# Patient Record
Sex: Male | Born: 1965 | Race: White | Hispanic: No | Marital: Married | Smoking: Never smoker
Health system: Southern US, Community
[De-identification: ages and names within clinical notes are randomized; demographics above are authoritative.]

## PROBLEM LIST (undated history)

## (undated) DIAGNOSIS — E7800 Pure hypercholesterolemia, unspecified: Secondary | ICD-10-CM

## (undated) DIAGNOSIS — I499 Cardiac arrhythmia, unspecified: Secondary | ICD-10-CM

---

## 2000-04-25 ENCOUNTER — Emergency Department (HOSPITAL_COMMUNITY): Admission: EM | Admit: 2000-04-25 | Discharge: 2000-04-25 | Payer: Self-pay | Admitting: Emergency Medicine

## 2003-03-18 ENCOUNTER — Emergency Department (HOSPITAL_COMMUNITY): Admission: EM | Admit: 2003-03-18 | Discharge: 2003-03-18 | Payer: Self-pay

## 2006-05-06 ENCOUNTER — Emergency Department (HOSPITAL_COMMUNITY): Admission: EM | Admit: 2006-05-06 | Discharge: 2006-05-06 | Payer: Self-pay | Admitting: Emergency Medicine

## 2007-03-30 IMAGING — CR DG CHEST 1V PORT
1 series · 1 of 1 positions shown · non-contrast
Comparison: 03/18/03.

CLINICAL DATA: Chest pain.
 PORTABLE CHEST ? 1 VIEW ? 05/06/06 ? 5499:

[view not recorded]
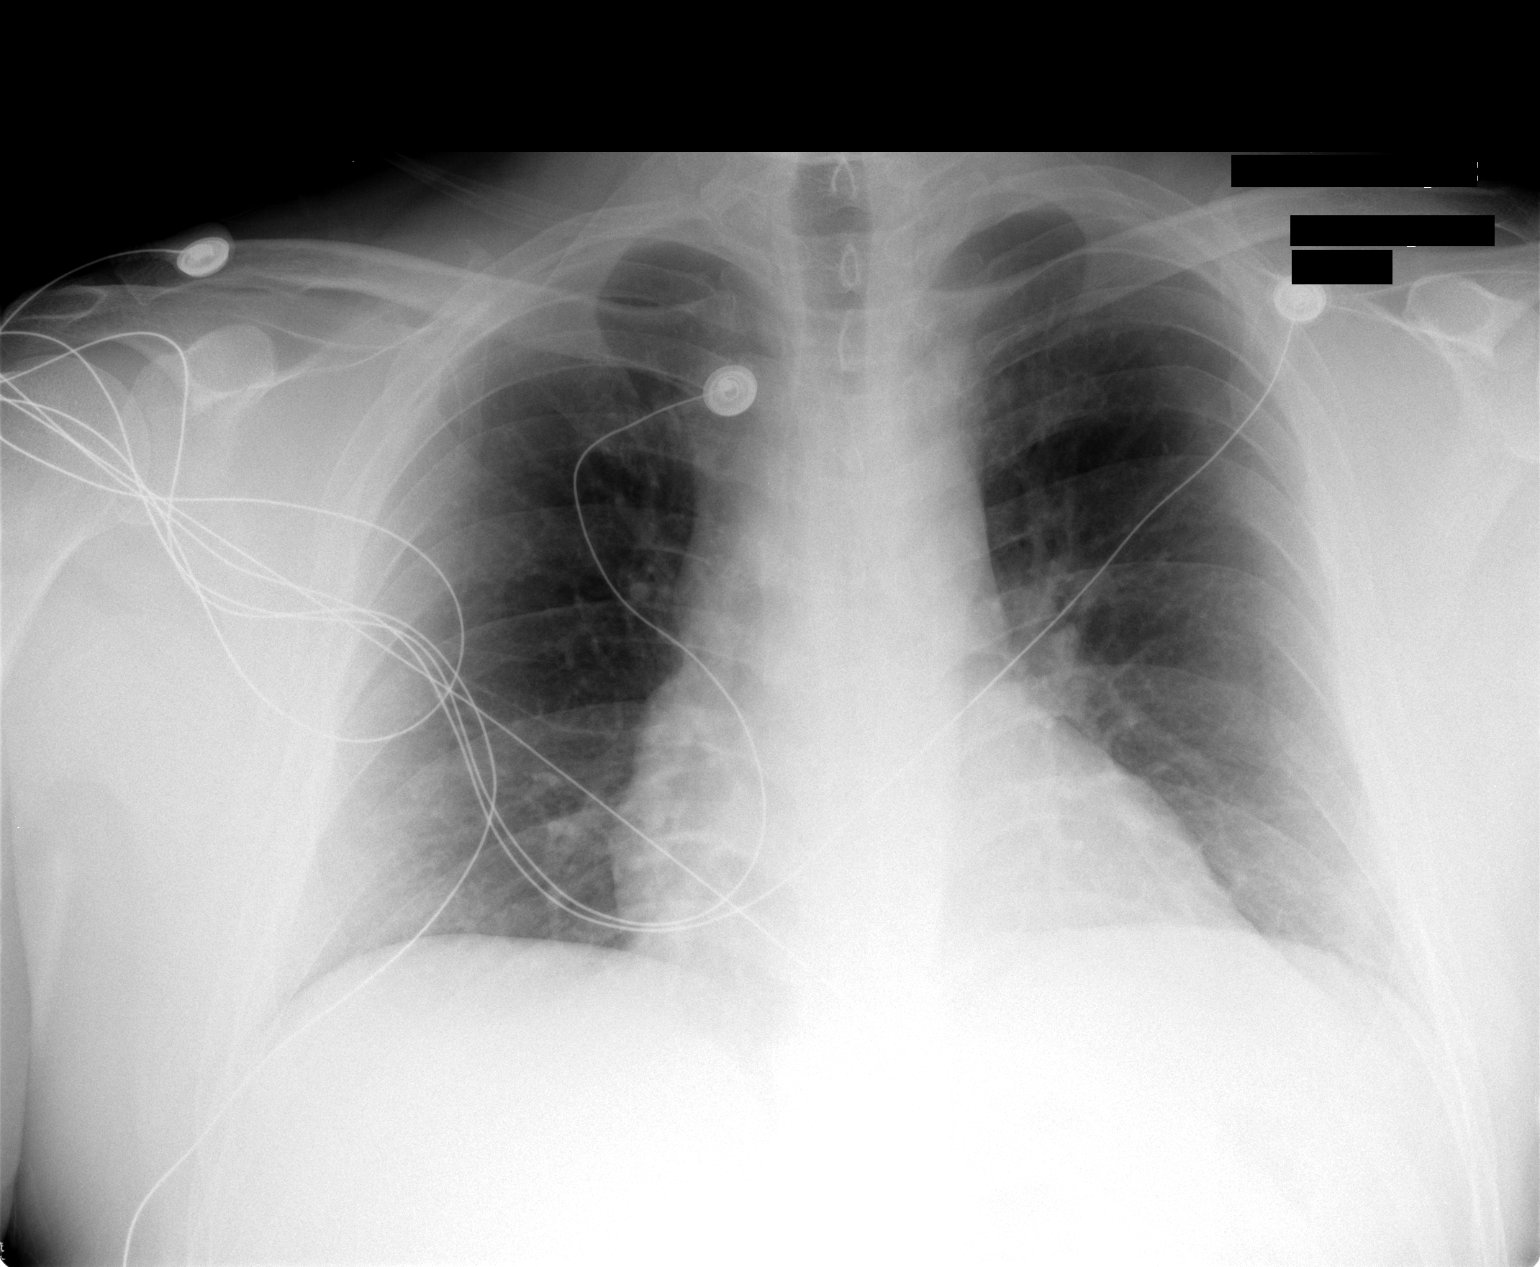

[1 of 1 positions shown; findings below may reference images not displayed]

FINDINGS: The cardiac silhouette, mediastinum and pulmonary vasculature are within normal limits.  Both lungs are clear.
IMPRESSION: Stable chest with no evidence of acute cardiac or pulmonary process.

## 2009-04-19 ENCOUNTER — Encounter: Admission: RE | Admit: 2009-04-19 | Discharge: 2009-04-19 | Payer: Self-pay | Admitting: Chiropractic Medicine

## 2010-03-13 IMAGING — CR DG PELVIS 1-2V
1 series · 1 of 1 positions shown · non-contrast
Comparison: Lumbar spine films same date.

CLINICAL DATA: Headaches.  Back pain.  Trouble sleeping.

PELVIS - 1-2 VIEW

[w pelvis]
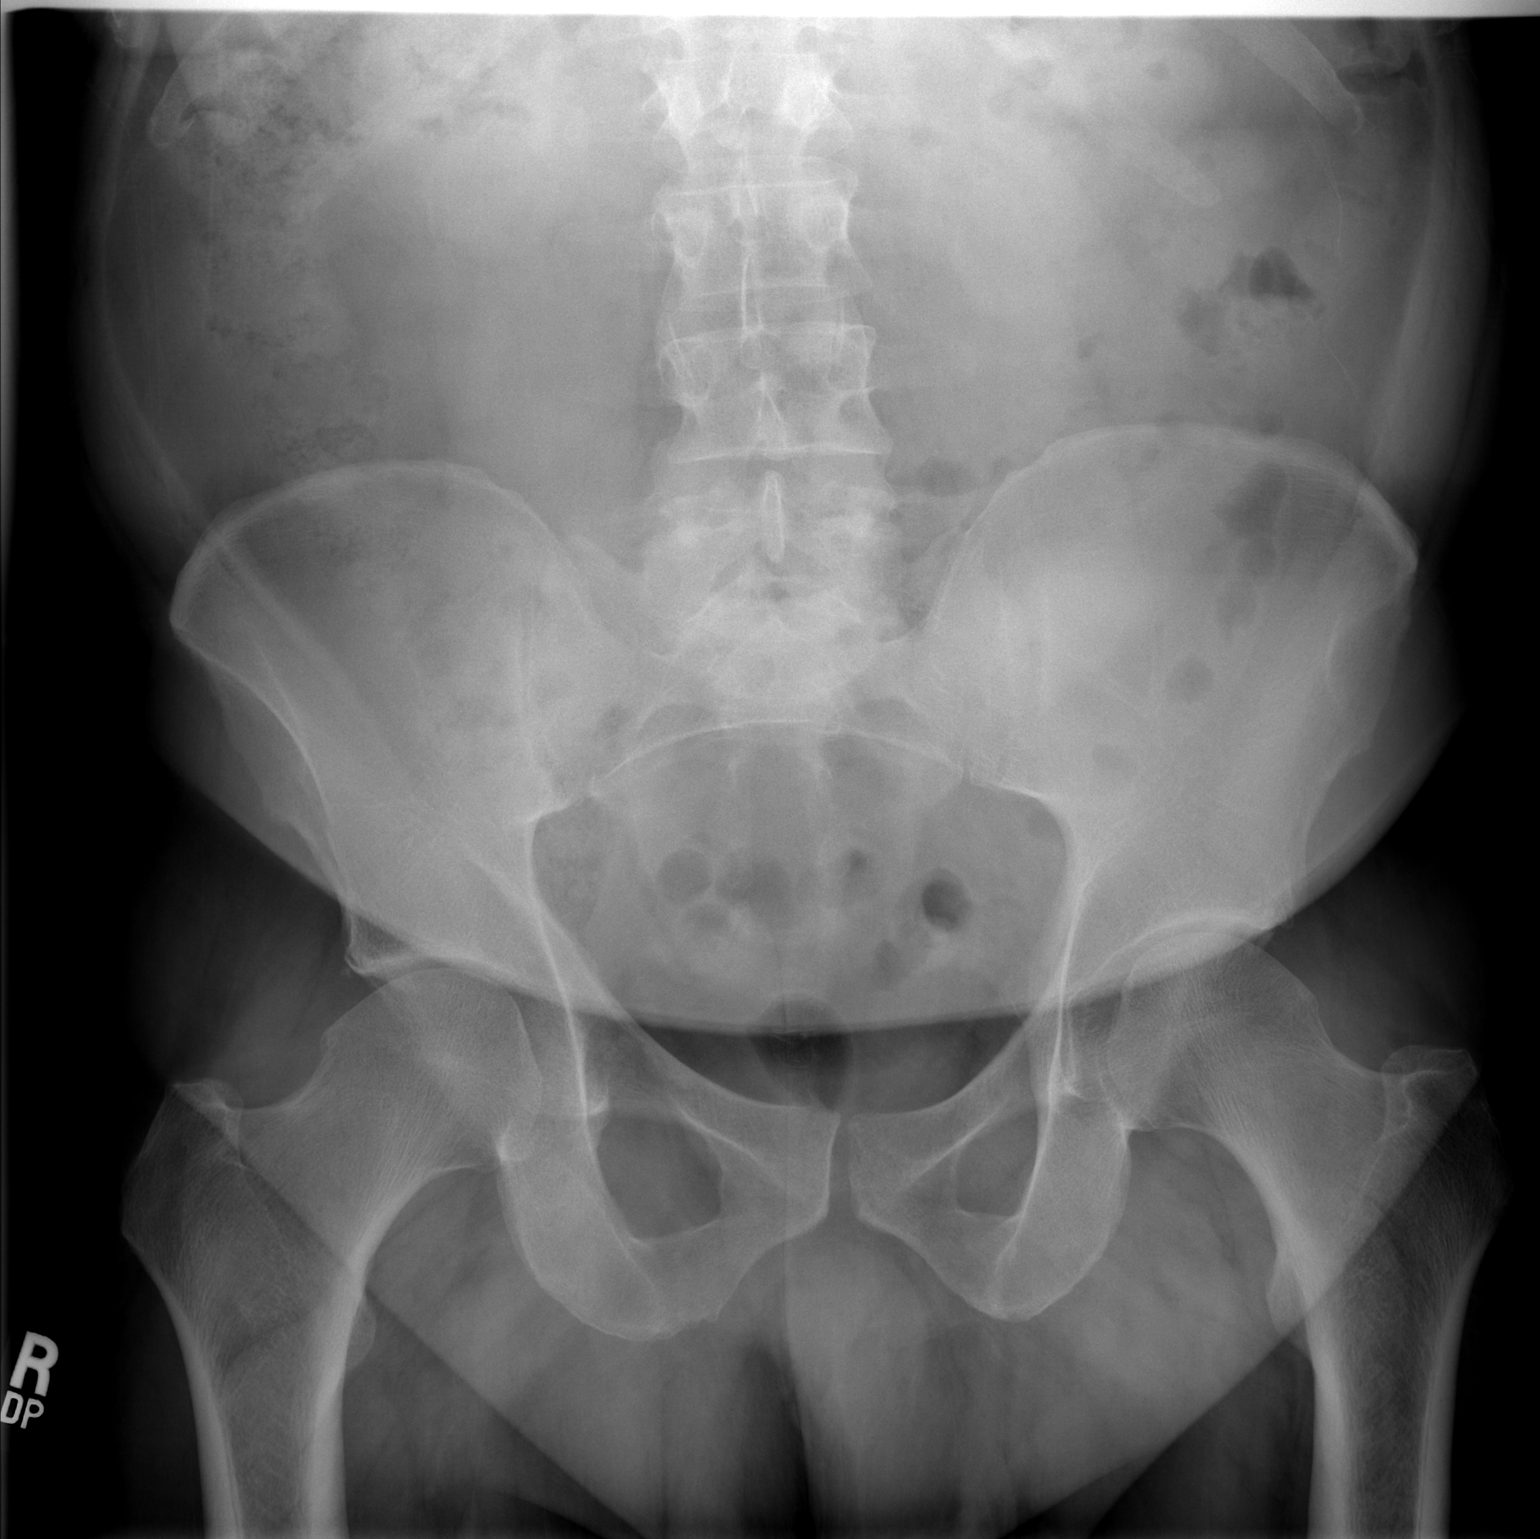

[1 of 1 positions shown; findings below may reference images not displayed]

FINDINGS: Both femoral heads are located.  No acute fracture.  No
focal osseous lesion.  Nonobstructive bowel gas pattern.
IMPRESSION: No acute findings about the pelvis.

## 2010-03-13 IMAGING — CR DG LUMBAR SPINE 1V
1 series · 1 of 1 positions shown · non-contrast
Comparison: None

CLINICAL DATA: Lumbago.  Trouble sleeping.

LUMBAR SPINE - 1 VIEW

[w l-spine lat]
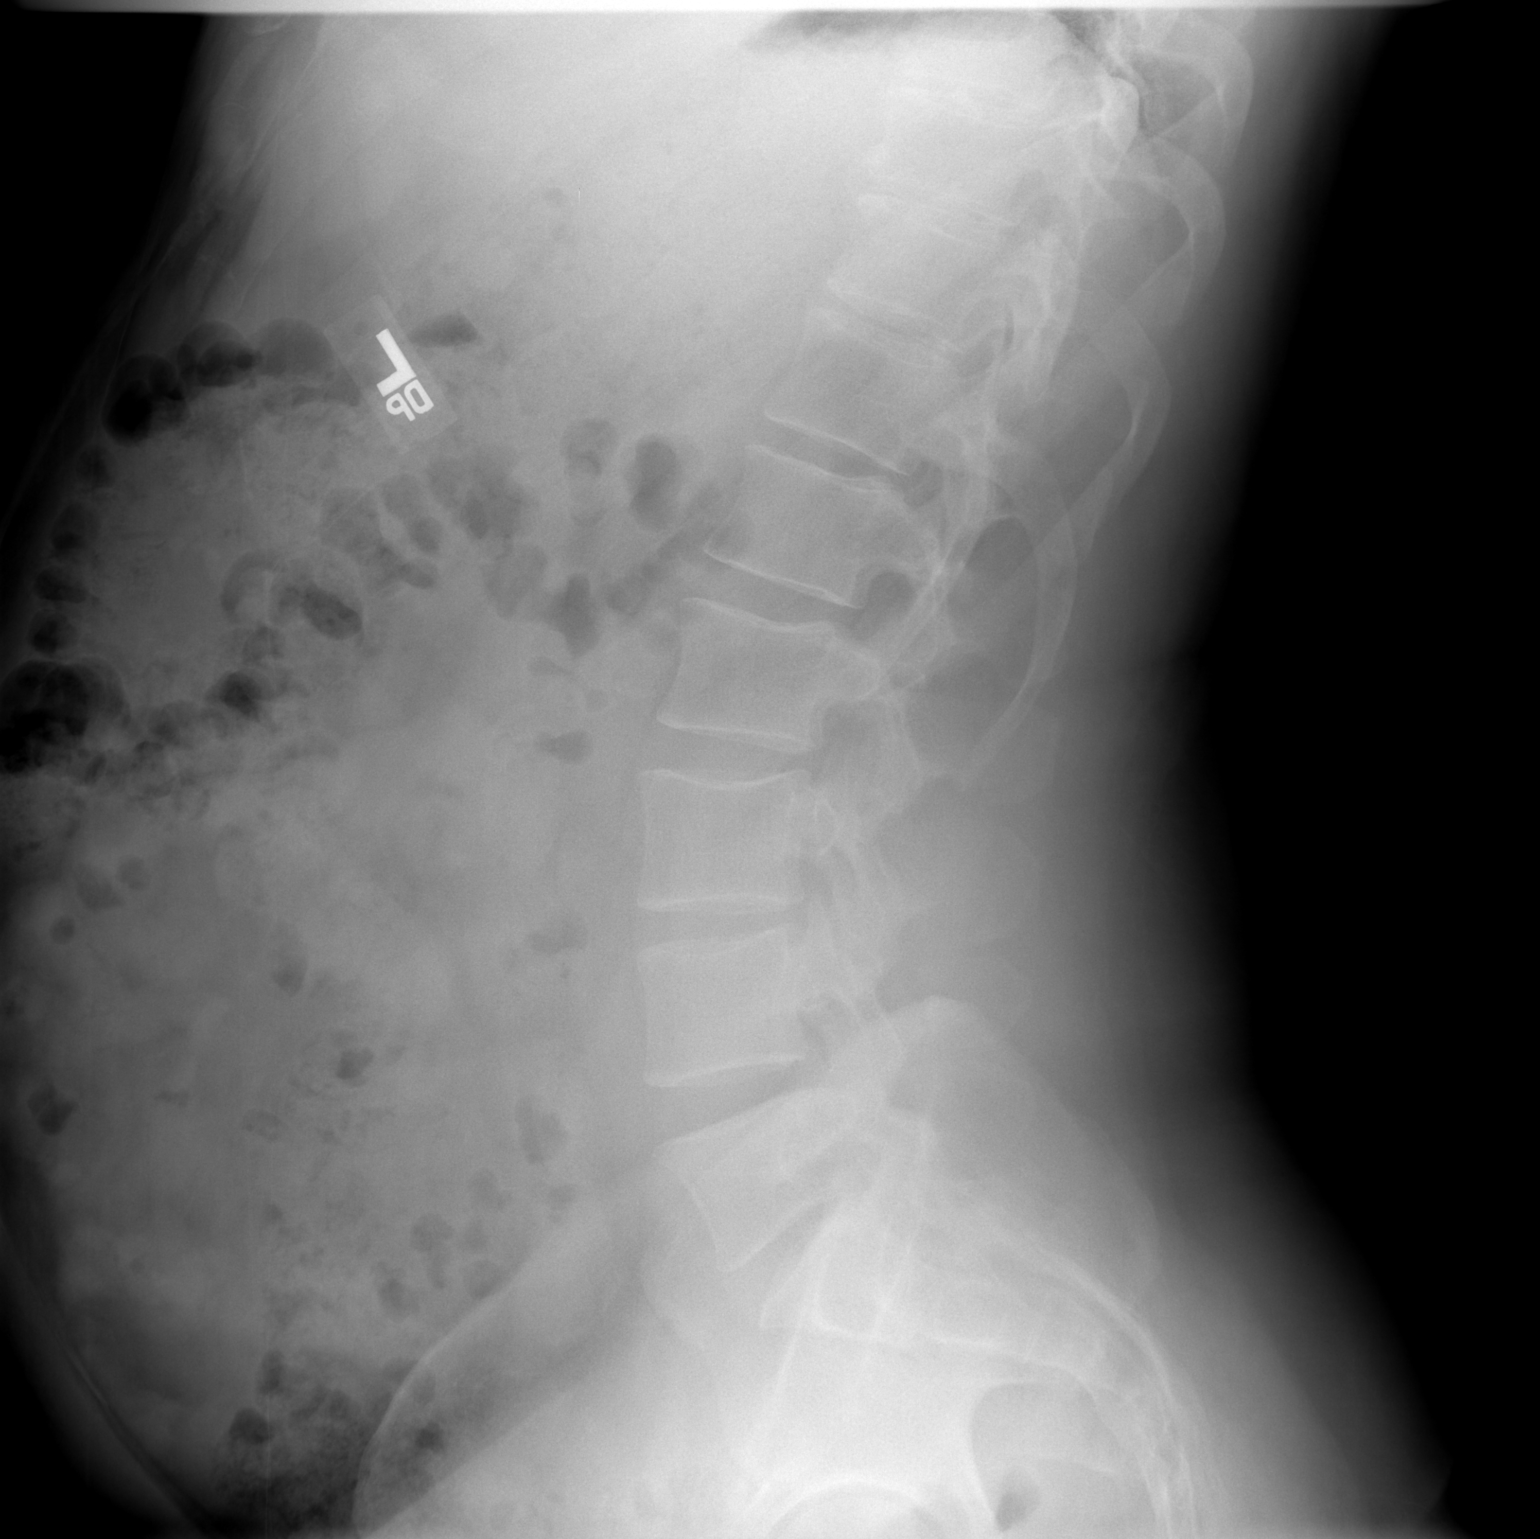

[1 of 1 positions shown; findings below may reference images not displayed]

FINDINGS: A single lateral view of the lumbar spine.  Maintenance
of vertebral body height alignment.  Intervertebral disc heights
are maintained.  There is a lower thoracic spondylosis including
T10-T11.  This is suboptimally evaluated.
IMPRESSION: No acute findings about the lumbar spine.

## 2013-08-06 ENCOUNTER — Emergency Department (HOSPITAL_COMMUNITY)
Admission: EM | Admit: 2013-08-06 | Discharge: 2013-08-06 | Disposition: A | Discharge status: Home / Self Care | Payer: BC Managed Care – PPO | Attending: Emergency Medicine | Admitting: Emergency Medicine

## 2013-08-06 ENCOUNTER — Encounter (HOSPITAL_COMMUNITY): Payer: Self-pay | Admitting: Emergency Medicine

## 2013-08-06 DIAGNOSIS — I824Y9 Acute embolism and thrombosis of unspecified deep veins of unspecified proximal lower extremity: Secondary | ICD-10-CM | POA: Insufficient documentation

## 2013-08-06 DIAGNOSIS — I82401 Acute embolism and thrombosis of unspecified deep veins of right lower extremity: Secondary | ICD-10-CM

## 2013-08-06 DIAGNOSIS — I10 Essential (primary) hypertension: Secondary | ICD-10-CM | POA: Insufficient documentation

## 2013-08-06 DIAGNOSIS — M7989 Other specified soft tissue disorders: Secondary | ICD-10-CM

## 2013-08-06 DIAGNOSIS — I824Z9 Acute embolism and thrombosis of unspecified deep veins of unspecified distal lower extremity: Secondary | ICD-10-CM | POA: Insufficient documentation

## 2013-08-06 DIAGNOSIS — Z791 Long term (current) use of non-steroidal anti-inflammatories (NSAID): Secondary | ICD-10-CM | POA: Insufficient documentation

## 2013-08-06 HISTORY — DX: Cardiac arrhythmia, unspecified: I49.9

## 2013-08-06 LAB — POCT I-STAT, CHEM 8
BUN: 14 mg/dL (ref 6–23)
Chloride: 103 mEq/L (ref 96–112)
Creatinine, Ser: 1.3 mg/dL (ref 0.50–1.35)
Glucose, Bld: 102 mg/dL — ABNORMAL HIGH (ref 70–99)
Hemoglobin: 15 g/dL (ref 13.0–17.0)
Potassium: 4.4 mEq/L (ref 3.5–5.1)
Sodium: 142 mEq/L (ref 135–145)
TCO2: 28 mmol/L (ref 0–100)

## 2013-08-06 MED ORDER — ENOXAPARIN SODIUM 120 MG/0.8ML ~~LOC~~ SOLN
115.0000 mg | Freq: Two times a day (BID) | SUBCUTANEOUS | Status: DC
  Administered 2013-08-06: 115 mg via SUBCUTANEOUS
  Filled 2013-08-06 (×2): qty 0.8

## 2013-08-06 MED ORDER — IBUPROFEN 800 MG PO TABS: 800.0000 mg | ORAL_TABLET | Freq: Three times a day (TID) | ORAL | Status: DC

## 2013-08-06 MED ORDER — ENOXAPARIN SODIUM 120 MG/0.8ML ~~LOC~~ SOLN: 1.0000 mg/kg | Freq: Two times a day (BID) | SUBCUTANEOUS | Status: DC

## 2013-08-06 NOTE — ED Notes (Addendum)
Rt calf pain and then went to rt ankle  Denies injury states it is swollen and hurts to walk  Drove a lot lately saw a walk in clinic today and was sent for [place]

## 2013-08-06 NOTE — ED Provider Notes (Signed)
CSN: [id number]     Arrival date & time 08/06/13  1216 History   First MD Initiated Contact with Patient 08/06/13 1241     Chief Complaint  Patient presents with  . Leg Pain   (Consider location/radiation/quality/duration/timing/severity/associated sxs/prior Treatment) HPI Comments: Patient is a 47 yo M PMHx significant for HTN presenting to the ED for 3-4 days of right calf pain and swelling. The patient states the pain initially started in his posterior thigh and began radiating down to foot. He states the pain is throbbing in nature and only truly bothers him while ambulating. He states he went to [facility] today to have his leg evaluated because he noticed his right calf was considerably swollen. The patient states his pain is managed with Motrin at home. His is not currently having any pain. The patient states he recently drove to [place], [place] prior to onset of symptoms. Patient has no history of DVT. Patient is seen at [facility].   Patient is a 47 y.o. male presenting with leg pain.  Leg Pain Associated symptoms: no fever     Past Medical History  Diagnosis Date  . Irregular heart beat    No past surgical history on file. No family history on file. History  Substance Use Topics  . Smoking status: Never Smoker   . Smokeless tobacco: Not on file  . Alcohol Use: No    Review of Systems  Constitutional: Negative for fever.  Respiratory: Negative for cough and shortness of breath.   Cardiovascular: Positive for leg swelling.  All other systems reviewed and are negative.    Allergies  Review of patient's allergies indicates no known allergies.  Home Medications   Current Outpatient Rx  Name  Route  Sig  Dispense  Refill  . ibuprofen (ADVIL,MOTRIN) 200 MG tablet   Oral   Take 800 mg by mouth every 8 (eight) hours as needed for moderate pain.         Marland Kitchen enoxaparin (LOVENOX) 120 MG/0.8ML injection   Subcutaneous   Inject 0.78 mLs (115 mg total) into  the skin every 12 (twelve) hours.   0.8 mL   20   . ibuprofen (ADVIL,MOTRIN) 800 MG tablet   Oral   Take 1 tablet (800 mg total) by mouth 3 (three) times daily.   21 tablet   0    BP 124/84  Pulse 73  Temp(Src) 98.1 F (36.7 C) (Oral)  Resp 19  Ht 5\' 8"  (1.727 m)  Wt 258 lb (117.028 kg)  BMI 39.24 kg/m2  SpO2 95% Physical Exam  Constitutional: He is oriented to person, place, and time. He appears well-developed and well-nourished. No distress.  HENT:  Head: Normocephalic and atraumatic.  Right Ear: External ear normal.  Left Ear: External ear normal.  Nose: Nose normal.  Mouth/Throat: Oropharynx is clear and moist.  Eyes: Conjunctivae are normal.  Neck: Normal range of motion. Neck supple.  Cardiovascular: Normal rate, regular rhythm, normal heart sounds and intact distal pulses.   Pulmonary/Chest: Effort normal and breath sounds normal. No respiratory distress.  Abdominal: Soft.  Musculoskeletal: Normal range of motion.       Right lower leg: He exhibits swelling.  Right calf twice as swollen as left calf.   Neurological: He is alert and oriented to person, place, and time.  Skin: Skin is warm and dry. He is not diaphoretic.  Psychiatric: He has a normal mood and affect.    ED Course  Procedures (including  critical care time) Medications  enoxaparin (LOVENOX) injection 115 mg (115 mg Subcutaneous Given 08/06/13 1556)    Labs Review Labs Reviewed  POCT I-STAT, CHEM 8 - Abnormal; Notable for the following:    Glucose, Bld 102 (*)    All other components within normal limits   Imaging Review No results found.  EKG Interpretation   None      VASCULAR LAB  PRELIMINARY PRELIMINARY PRELIMINARY PRELIMINARY  Right lower extremity venous Doppler completed.  Preliminary report: There is acute, occlusive DVT noted in the right posterior tibial vein and acute, non occlusive DVT noted in the right popliteal vein.  KANADY, CANDACE, RVT  08/06/2013, 1:47 PM  MDM    1. Deep vein thrombosis (DVT), right    Afebrile, NAD, non-toxic appearing, AAOx4. Neurovascularly intact. Normal sensation. Right calf > left calf. Venous doppler revealed right posterior tibial vein and popliteal vein DVT. Lovenox started in ED. Kidney function wnl. Patient is adamant about not having to stay in the hospital. Patient has experience with giving injections, as he has an insulin dependent child. Patient has a primary care doctor at [facility] that he can follow up with and be managed by. Patient does not want any narcotic pain medication for pain. I feel that patient is reliable for outpatient management and follow up. Return precautions discussed. Patient is agreeable to plan. Patient is stable at time of discharge. Patient d/w with Dr. Jeraldine Loots, agrees with plan.           Jeannetta Ellis, PA-C 08/06/13 1601

## 2013-08-06 NOTE — Progress Notes (Signed)
VASCULAR LAB PRELIMINARY  PRELIMINARY  PRELIMINARY  PRELIMINARY  Right lower extremity venous Doppler completed.    Preliminary report:  There is acute, occlusive DVT noted in the right posterior tibial vein and acute, non occlusive DVT noted in the right popliteal vein.  Alinna Siple, RVT 08/06/2013, 1:47 PM

## 2013-08-06 NOTE — Consult Note (Signed)
ANTICOAGULATION CONSULT NOTE - Initial Consult  Pharmacy Consult for Lovenox Indication: DVT  No Known Allergies  Patient Measurements: Height: 5\' 8"  (172.7 cm) Weight: 258 lb (117.028 kg) IBW/kg (Calculated) : 68.4  Vital Signs: Temp: 98.1 F (36.7 C) (12/14 1500) Temp src: Oral (12/14 1500) BP: 138/81 mmHg (12/14 1500) Pulse Rate: 80 (12/14 1500)  Labs:  Recent Labs  08/06/13 1448  HGB 15.0  HCT 44.0  CREATININE 1.30    Estimated Creatinine Clearance: 88.2 ml/min (by C-G formula based on Cr of 1.3).   Medical History: Past Medical History  Diagnosis Date  . Irregular heart beat    Assessment: 47yom presented to the ED with right calf pain. RLE doppler positive for DVT. He will begin therapeutic lovenox. Wt = 117kg. SCr 1.3 and CrCl 24ml/min. Baseline CBC pending.   Goal of Therapy:  Anti-Xa level 0.6-1 units/ml 4hrs after LMWH dose given Monitor platelets by anticoagulation protocol: Yes   Plan:  1) Lovenox 115mg  sq q12 (1mg /kg q12) 2) CBC q72   Fredrik Rigger 08/06/2013,3:17 PM

## 2013-08-06 NOTE — ED Provider Notes (Signed)
  Medical screening examination/treatment/procedure(s) were performed by non-physician practitioner and as supervising physician I was immediately available for consultation/collaboration.  EKG Interpretation   None          Lannette Avellino, MD 08/06/13 1601 

## 2015-09-06 ENCOUNTER — Ambulatory Visit (INDEPENDENT_AMBULATORY_CARE_PROVIDER_SITE_OTHER): Payer: BLUE CROSS/BLUE SHIELD | Admitting: Endocrinology

## 2015-09-06 ENCOUNTER — Encounter: Payer: Self-pay | Admitting: Endocrinology

## 2015-09-06 VITALS — BP 134/84 | HR 74 | Temp 98.4°F | Ht 69.5 in | Wt 272.0 lb

## 2015-09-06 DIAGNOSIS — N529 Male erectile dysfunction, unspecified: Secondary | ICD-10-CM | POA: Insufficient documentation

## 2015-09-06 DIAGNOSIS — E291 Testicular hypofunction: Secondary | ICD-10-CM | POA: Insufficient documentation

## 2015-09-06 DIAGNOSIS — E785 Hyperlipidemia, unspecified: Secondary | ICD-10-CM | POA: Diagnosis not present

## 2015-09-06 DIAGNOSIS — I10 Essential (primary) hypertension: Secondary | ICD-10-CM | POA: Diagnosis not present

## 2015-09-06 DIAGNOSIS — J309 Allergic rhinitis, unspecified: Secondary | ICD-10-CM | POA: Diagnosis not present

## 2015-09-06 DIAGNOSIS — G4733 Obstructive sleep apnea (adult) (pediatric): Secondary | ICD-10-CM

## 2015-09-06 LAB — IBC PANEL
Iron: 82 ug/dL (ref 42–165)
SATURATION RATIOS: 25 % (ref 20.0–50.0)
TRANSFERRIN: 234 mg/dL (ref 212.0–360.0)

## 2015-09-06 MED ORDER — SILDENAFIL CITRATE 100 MG PO TABS: 100.0000 mg | ORAL_TABLET | Freq: Every day | ORAL | Status: DC | PRN

## 2015-09-06 NOTE — Progress Notes (Signed)
Subjective:    Patient ID: Christian Brady, male    DOB: Sep 29, 1965, 50 y.o.   MRN: [id number]  HPI Pt reports he had puberty at the normal age.  He had 2 biological children, then had vasectomy.  He says he has never taken illicit androgens.  He has never been on any prescribed medication for hypogonadism.  He does not take antiandrogens or opioids.  He denies any h/o infertility, XRT, or genital infection.  He has never had surgery, or a serious injury to the head or genital area.  He does not consume alcohol excessively.  At PCP, he reported slight swelling at the anterior neck, and assoc snoring.  These have improved with weight loss and protonix.  Past Medical History  Diagnosis Date  . Irregular heart beat     No past surgical history on file.  Social History   Social History  . Marital Status: Married    Spouse Name: N/A  . Number of Children: N/A  . Years of Education: N/A   Occupational History  . Not on file.   Social History Main Topics  . Smoking status: Never Smoker   . Smokeless tobacco: Not on file  . Alcohol Use: No  . Drug Use: Not on file  . Sexual Activity: Not on file   Other Topics Concern  . Not on file   Social History Narrative    No current outpatient prescriptions on file prior to visit.   No current facility-administered medications on file prior to visit.    No Known Allergies  Family History  Problem Relation Age of Onset  . Other Neg Hx     hypogonadism    BP 134/84 mmHg  Pulse 74  Temp(Src) 98.4 F (36.9 C) (Oral)  Ht 5' 9.5" (1.765 m)  Wt 272 lb (123.378 kg)  BMI 39.60 kg/m2  SpO2 97%   Review of Systems denies depression, numbness, erectile dysfunction, fatigue, decreased urinary stream, gynecomastia, muscle weakness, headache, easy bruising, sob, rash, blurry vision, rhinorrhea, chest pain.  He has ED sxs.     Objective:   Physical Exam VS: see vs page GEN: no distress HEAD: head: no deformity eyes: no periorbital  swelling, no proptosis external nose and ears are normal mouth: no lesion seen NECK: supple, thyroid is not enlarged CHEST WALL: no deformity LUNGS: clear to auscultation BREASTS:  No gynecomastia CV: reg rate and rhythm, no murmur ABD: abdomen is soft, nontender.  no hepatosplenomegaly.  not distended.  no hernia.  GENITALIA:  Normal male.   MUSCULOSKELETAL: muscle bulk and strength are grossly normal.  no obvious joint swelling.  gait is normal and steady EXTEMITIES: no deformity.  1+ bilat leg edema. PULSES: no carotid bruit. NEURO:  cn 2-12 grossly intact.   readily moves all 4's.  sensation is intact to touch on all 4's.  SKIN:  Normal texture and temperature.  No rash or suspicious lesion is visible.  Normal hair distribution.  NODES:  None palpable at the neck PSYCH: alert, well-oriented.  Does not appear anxious nor depressed.   outside test results are reviewed: Testosterone=217  I have reviewed outside records, and summarized: Pt was noted to have hypogonadism, and referred here.     Assessment & Plan:  Hypogonadism, new, uncertain etiology ED: possibly due to hypogonadism obstructive sleep apnea: this could worsen with rx of hypogonadism.  We'll follow.   Patient is advised the following: Patient Instructions  blood tests are requested for you today.  We'll let you know about the results.  Based on the results, I may be able to prescribe for you a pill called "clomiphene."  normalization of testosterone is not known to harm you.  however, there are "theoretical" risks, including increased fertility, hair loss, prostate cancer, benign prostate enlargement, blood clots, liver problems, lower hdl ("good cholesterol"), polycythemia (opposite of anemia), sleep apnea, and behavior changes.   i have sent a prescription to your pharmacy, for viagra.

## 2015-09-06 NOTE — Patient Instructions (Addendum)
blood tests are requested for you today.  We'll let you know about the results.  Based on the results, I may be able to prescribe for you a pill called "clomiphene."  normalization of testosterone is not known to harm you.  however, there are "theoretical" risks, including increased fertility, hair loss, prostate cancer, benign prostate enlargement, blood clots, liver problems, lower hdl ("good cholesterol"), polycythemia (opposite of anemia), sleep apnea, and behavior changes.   i have sent a prescription to your pharmacy, for viagra.

## 2015-09-07 LAB — PROLACTIN: PROLACTIN: 6.7 ng/mL (ref 2.1–17.1)

## 2015-09-08 LAB — TESTOSTERONE,FREE AND TOTAL
TESTOSTERONE: 190 ng/dL — AB (ref 348–1197)
Testosterone, Free: 8.6 pg/mL (ref 6.8–21.5)

## 2015-09-09 ENCOUNTER — Other Ambulatory Visit: Payer: Self-pay

## 2015-09-09 LAB — LUTEINIZING HORMONE: LH: 3.35 m[IU]/mL (ref 1.50–9.30)

## 2015-09-10 ENCOUNTER — Telehealth: Payer: Self-pay | Admitting: Endocrinology

## 2015-09-10 NOTE — Telephone Encounter (Signed)
please call patient: We got PA form for viagra. This means it is better to buy the 20-mg version without a prescription. This is cheapest at [facility]. please let me know.

## 2015-09-11 MED ORDER — SILDENAFIL CITRATE 20 MG PO TABS: ORAL_TABLET | ORAL | Status: AC

## 2015-09-11 NOTE — Telephone Encounter (Signed)
sent 

## 2015-09-11 NOTE — Telephone Encounter (Signed)
Pt advised of note below and verbalized understanding. Pt will go to [facility] to pick the medication up.

## 2015-09-11 NOTE — Telephone Encounter (Signed)
Requested a call back from the pt to discuss.  

## 2020-03-20 ENCOUNTER — Encounter (HOSPITAL_BASED_OUTPATIENT_CLINIC_OR_DEPARTMENT_OTHER): Payer: Self-pay

## 2020-03-20 ENCOUNTER — Other Ambulatory Visit: Payer: Self-pay

## 2020-03-20 ENCOUNTER — Emergency Department (HOSPITAL_BASED_OUTPATIENT_CLINIC_OR_DEPARTMENT_OTHER)
Admission: EM | Admit: 2020-03-20 | Discharge: 2020-03-20 | Disposition: A | Discharge status: Home / Self Care | Payer: 59 | Attending: Emergency Medicine | Admitting: Emergency Medicine

## 2020-03-20 DIAGNOSIS — Z79899 Other long term (current) drug therapy: Secondary | ICD-10-CM | POA: Insufficient documentation

## 2020-03-20 DIAGNOSIS — I10 Essential (primary) hypertension: Secondary | ICD-10-CM

## 2020-03-20 HISTORY — DX: Pure hypercholesterolemia, unspecified: E78.00

## 2020-03-20 MED ORDER — AMLODIPINE BESYLATE 5 MG PO TABS: 5.0000 mg | ORAL_TABLET | Freq: Every day | ORAL | 1 refills | Status: AC

## 2020-03-20 MED FILL — AMLODIPINE BESYLATE 5 MG TA: 5 | 30 days supply | Qty: 30 | Fill #0

## 2020-03-20 NOTE — ED Provider Notes (Signed)
[facility] EMERGENCY DEPARTMENT Provider Note   CSN: [id number] Arrival date & time: 03/20/20  1155     History Chief Complaint  Patient presents with  . Hypertension    Christian Brady is a 54 y.o. male.  Patient sent from primary care doctor's office due to high blood pressure.  No history of high blood pressure and out of medication for it.  No chest pain, no headache, no stroke symptoms.  The history is provided by the patient.  Hypertension This is a new problem. Pertinent negatives include no chest pain, no abdominal pain, no headaches and no shortness of breath. Nothing aggravates the symptoms. Nothing relieves the symptoms. He has tried nothing for the symptoms. The treatment provided no relief.       Past Medical History:  Diagnosis Date  . High cholesterol   . Irregular heart beat     Patient Active Problem List   Diagnosis Date Noted  . Hypogonadism, male 09/06/2015  . HTN (hypertension) 09/06/2015  . Dyslipidemia 09/06/2015  . Allergic rhinitis 09/06/2015  . Erectile dysfunction 09/06/2015  . Obstructive sleep apnea 09/06/2015    History reviewed. No pertinent surgical history.     Family History  Problem Relation Age of Onset  . Other Neg Hx        hypogonadism    Social History   Tobacco Use  . Smoking status: Never Smoker  . Smokeless tobacco: Never Used  Substance Use Topics  . Alcohol use: No  . Drug use: Never    Home Medications Prior to Admission medications   Medication Sig Start Date End Date Taking? Authorizing Provider  amLODipine (NORVASC) 5 MG tablet Take 1 tablet (5 mg total) by mouth daily. 03/20/20 04/19/20  Tristain Daily, DO  pantoprazole (PROTONIX) 40 MG tablet Take 40 mg by mouth daily.    [provider]  sildenafil (REVATIO) 20 MG tablet 2-5 tabs, as needed for ED symptoms 09/11/15   Romero Belling, MD    Allergies    Patient has no known allergies.  Review of Systems   Review of Systems   Constitutional: Negative for chills and fever.  HENT: Negative for ear pain and sore throat.   Eyes: Negative for pain and visual disturbance.  Respiratory: Negative for cough and shortness of breath.   Cardiovascular: Negative for chest pain and palpitations.  Gastrointestinal: Negative for abdominal pain and vomiting.  Genitourinary: Negative for dysuria and hematuria.  Musculoskeletal: Negative for arthralgias and back pain.  Skin: Negative for color change and rash.  Neurological: Negative for seizures, syncope and headaches.  All other systems reviewed and are negative.   Physical Exam Updated Vital Signs BP (!) 191/110 (BP Location: Right Arm)   Pulse 83   Temp 99 F (37.2 C) (Oral)   Resp 18   Ht 5\' 10"  (1.778 m)   Wt (!) 129.3 kg   SpO2 99%   BMI 40.89 kg/m   Physical Exam Vitals and nursing note reviewed.  Constitutional:      General: He is not in acute distress.    Appearance: He is well-developed. He is not ill-appearing.  HENT:     Head: Normocephalic and atraumatic.     Nose: Nose normal.     Mouth/Throat:     Mouth: Mucous membranes are moist.  Eyes:     Extraocular Movements: Extraocular movements intact.     Conjunctiva/sclera: Conjunctivae normal.     Pupils: Pupils are equal, round, and reactive to  light.  Cardiovascular:     Rate and Rhythm: Normal rate and regular rhythm.     Pulses: Normal pulses.     Heart sounds: Normal heart sounds. No murmur heard.   Pulmonary:     Effort: Pulmonary effort is normal. No respiratory distress.     Breath sounds: Normal breath sounds.  Abdominal:     Palpations: Abdomen is soft.     Tenderness: There is no abdominal tenderness.  Musculoskeletal:     Cervical back: Neck supple.  Skin:    General: Skin is warm and dry.     Capillary Refill: Capillary refill takes less than 2 seconds.  Neurological:     General: No focal deficit present.     Mental Status: He is alert and oriented to person, place, and  time.     Cranial Nerves: No cranial nerve deficit.     Sensory: No sensory deficit.     Motor: No weakness.     Coordination: Coordination normal.  Psychiatric:        Mood and Affect: Mood normal.     ED Results / Procedures / Treatments   Labs (all labs ordered are listed, but only abnormal results are displayed) Labs Reviewed - No data to display  EKG None  Radiology No results found.  Procedures Procedures (including critical care time)  Medications Ordered in ED Medications - No data to display  ED Course  I have reviewed the triage vital signs and the nursing notes.  Pertinent labs & imaging results that were available during my care of the patient were reviewed by me and considered in my medical decision making (see chart for details).    MDM Rules/Calculators/A&P                           Christian Brady is a 54 year old male with no significant medical history presents to the ED with high blood pressure.  Blood pressure 191/110.  Blood pressure was elevated above 200 and primary care doctor's office and he was sent for evaluation.  No chest pain, no headache, no stroke symptoms.  Neurologically he is intact.  Overall he is asymptomatic.  Today was his annual physical.  Blood work was drawn at that visit.  Suspect that he does have some underlying hypertension and will start amlodipine.  No concern for ACS or stroke.  Recommend starting blood pressure medication and keeping blood pressure log for follow-up with his primary care doctor in several weeks.  Understands return if symptoms worsen including severe chest pain, stroke symptoms.  Discharged in good condition.  Understands return precautions.  This chart was dictated using voice recognition software.  Despite best efforts to proofread,  errors can occur which can change the documentation meaning.    Final Clinical Impression(s) / ED Diagnoses Final diagnoses:  Hypertension, unspecified type    Rx / DC  Orders ED Discharge Orders         Ordered    amLODipine (NORVASC) 5 MG tablet  Daily     Discontinue  Reprint     03/20/20 1216           Virgina Norfolk, DO 03/20/20 1219

## 2020-03-20 NOTE — ED Triage Notes (Addendum)
Pt states he was sent by PCP for elevated BP during annual physical-reports no hx of dx HTN or meds-states he was advised normal EKG and elevated BP is possible "anomoly" r/t to being in medical office and was sent to ED-NAD-steady gait
